# Patient Record
Sex: Female | Born: 1985 | Race: White | Hispanic: No | Marital: Single | Smoking: Former smoker
Health system: Southern US, Community
[De-identification: ages and names within clinical notes are randomized; demographics above are authoritative.]

## PROBLEM LIST (undated history)

## (undated) HISTORY — PX: OVARIAN CYST REMOVAL: SHX89

## (undated) HISTORY — PX: GUM SURGERY: SHX658

## (undated) HISTORY — PX: TONSILLECTOMY: SUR1361

---

## 2017-10-31 ENCOUNTER — Encounter: Payer: Self-pay | Admitting: Emergency Medicine

## 2017-10-31 ENCOUNTER — Emergency Department
Admission: EM | Admit: 2017-10-31 | Discharge: 2017-10-31 | Disposition: A | Discharge status: Home / Self Care | Payer: Self-pay | Attending: Emergency Medicine | Admitting: Emergency Medicine

## 2017-10-31 ENCOUNTER — Emergency Department: Payer: Self-pay

## 2017-10-31 DIAGNOSIS — Y998 Other external cause status: Secondary | ICD-10-CM | POA: Insufficient documentation

## 2017-10-31 DIAGNOSIS — Z87891 Personal history of nicotine dependence: Secondary | ICD-10-CM | POA: Insufficient documentation

## 2017-10-31 DIAGNOSIS — Y92002 Bathroom of unspecified non-institutional (private) residence single-family (private) house as the place of occurrence of the external cause: Secondary | ICD-10-CM | POA: Insufficient documentation

## 2017-10-31 DIAGNOSIS — S60212A Contusion of left wrist, initial encounter: Secondary | ICD-10-CM | POA: Insufficient documentation

## 2017-10-31 DIAGNOSIS — Y93E1 Activity, personal bathing and showering: Secondary | ICD-10-CM | POA: Insufficient documentation

## 2017-10-31 DIAGNOSIS — Y92009 Unspecified place in unspecified non-institutional (private) residence as the place of occurrence of the external cause: Secondary | ICD-10-CM

## 2017-10-31 DIAGNOSIS — W19XXXA Unspecified fall, initial encounter: Secondary | ICD-10-CM

## 2017-10-31 DIAGNOSIS — W010XXA Fall on same level from slipping, tripping and stumbling without subsequent striking against object, initial encounter: Secondary | ICD-10-CM | POA: Insufficient documentation

## 2017-10-31 NOTE — Discharge Instructions (Addendum)
Your exam and x-ray are negative for fracture or dislocation. Wear the splint for comfort and support. Apply ice to reduce swelling. Take Naproxen for pain and inflammation.

## 2017-10-31 NOTE — ED Triage Notes (Signed)
Patient states that she slipped and fell in the shower and landed on her left wrist. Patient with pain and swelling to left wrist.

## 2017-10-31 NOTE — ED Notes (Signed)
Pt states that she was in the shower when she slipped getting out of shower and she landed on her left hand and wrist. Pt alert and oriented x 4. No LOC. Family at bedside.

## 2017-11-04 NOTE — ED Provider Notes (Signed)
[facility] Emergency Department Provider Note ____________________________________________  Time seen: 2018  I have reviewed the triage vital signs and the nursing notes.  HISTORY  Chief Complaint  Fall and Wrist Pain  HPI Anna Gilbert is a 32 y.o. female presents to the ED accompanied by family, for evaluation of injury after a slip and fall.  Patient describes she slipped and fell getting out of her shower at home, and landed on her left wrist.  She denies any head injury, loss of consciousness, laceration, or abrasion patient presents with some swelling to the dorsal aspect of the left hand and wrist.  No other injuries reported at this time.  History reviewed. No pertinent past medical history.  There are no active problems to display for this patient.   Past Surgical History:  Procedure Laterality Date  . CESAREAN SECTION    . GUM SURGERY    . OVARIAN CYST REMOVAL    . TONSILLECTOMY      Prior to Admission medications   Not on File    Allergies Fish allergy; Flagyl [metronidazole]; and Shellfish allergy  No family history on file.  Social History Social History   Tobacco Use  . Smoking status: Former Games developer  . Smokeless tobacco: Never Used  Substance Use Topics  . Alcohol use: Not on file  . Drug use: Not on file    Review of Systems  Constitutional: Negative for fever. Eyes: Negative for visual changes. ENT: Negative for sore throat. Cardiovascular: Negative for chest pain. Respiratory: Negative for shortness of breath. Gastrointestinal: Negative for abdominal pain, vomiting and diarrhea. Genitourinary: Negative for dysuria. Musculoskeletal: Negative for back pain.  Left wrist pain as above. Skin: Negative for rash. Neurological: Negative for headaches, focal weakness or numbness. ____________________________________________  PHYSICAL EXAM:  VITAL SIGNS: ED Triage Vitals  Enc Vitals Group     BP 10/31/17 1932 (!) 129/47      Pulse Rate 10/31/17 1932 77     Resp 10/31/17 1932 18     Temp 10/31/17 1931 98.4 F (36.9 C)     Temp Source 10/31/17 1931 Oral     SpO2 10/31/17 1932 99 %     Weight 10/31/17 1931 155 lb (70.3 kg)     Height 10/31/17 1931 5\' 4"  (1.626 m)     Head Circumference --      Peak Flow --      Pain Score 10/31/17 1931 10     Pain Loc --      Pain Edu? --      Excl. in GC? --     Constitutional: Alert and oriented. Well appearing and in no distress. Head: Normocephalic and atraumatic. Cardiovascular: Normal rate, regular rhythm. Normal distal pulses. Respiratory: Normal respiratory effort. No wheezes/rales/rhonchi. Musculoskeletal: Left wrist without obvious deformity or dislocation.  Some soft tissue swelling noted dorsally to the wrist.  Patient with normal composite fist and normal wrist flexion extension range.  Nontender with normal range of motion in all extremities.  Neurologic: Nerves II through XII grossly intact.  Normal UE DTRs bilaterally.  Normal intrinsic and opposition testing noted.  Normal speech and language. No gross focal neurologic deficits are appreciated. Skin:  Skin is warm, dry and intact. No rash noted. ____________________________________________   RADIOLOGY  Left Wrist IMPRESSION: Negative. ____________________________________________  PROCEDURES  Procedures Ace bandage ____________________________________________  INITIAL IMPRESSION / ASSESSMENT AND PLAN / ED COURSE  She with ED evaluation of injury sustained following a mechanical fall.  Patient exam  is overall benign and her x-ray is reassuring as it shows no acute doctor or dislocation.  Patient was placed in Ace bandage for support and will follow-up with her primary provider for ongoing symptoms. ____________________________________________  FINAL CLINICAL IMPRESSION(S) / ED DIAGNOSES  Final diagnoses:  Fall in home, initial encounter  Contusion of left wrist, initial encounter       Lissa HoardMenshew, Kendle Turbin V Bacon, PA-C 11/04/17 1654    Jeanmarie PlantMcShane, James A, MD 11/14/17 1540

## 2018-10-12 IMAGING — DX DG WRIST COMPLETE 3+V*L*
4 series · 4 of 4 positions shown · non-contrast
Comparison: None.

CLINICAL DATA: Slip and fall in shower landing on LEFT wrist. Pain
and swelling.

EXAM:
LEFT WRIST - COMPLETE 3+ VIEW

[wrist ap (1 of 2)]
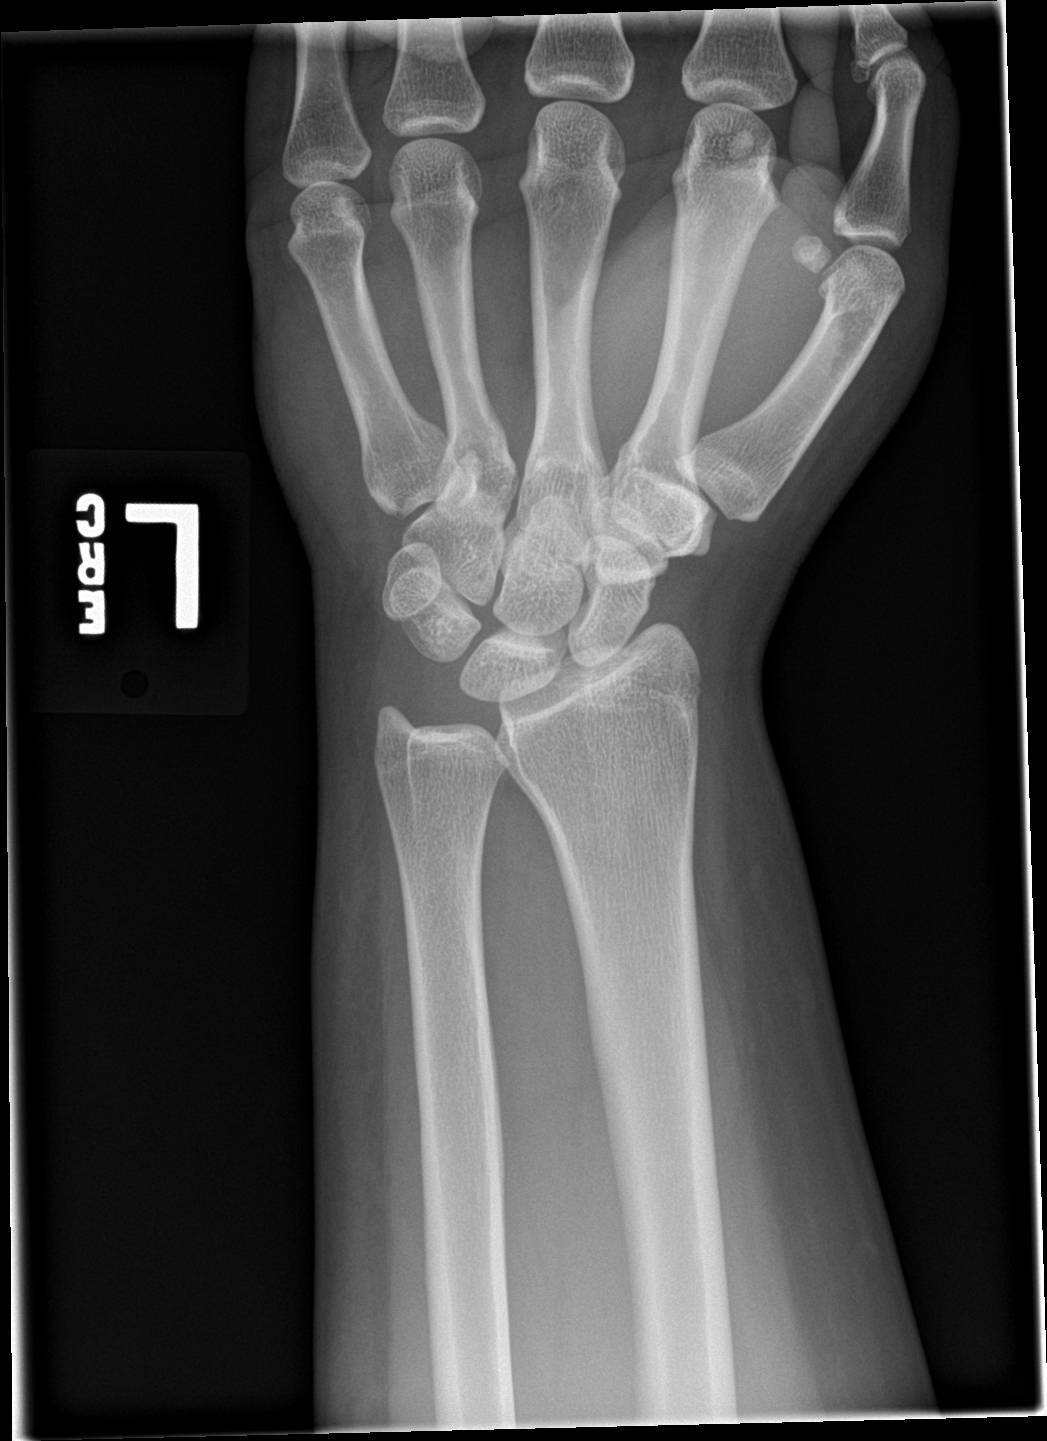

[wrist obl]
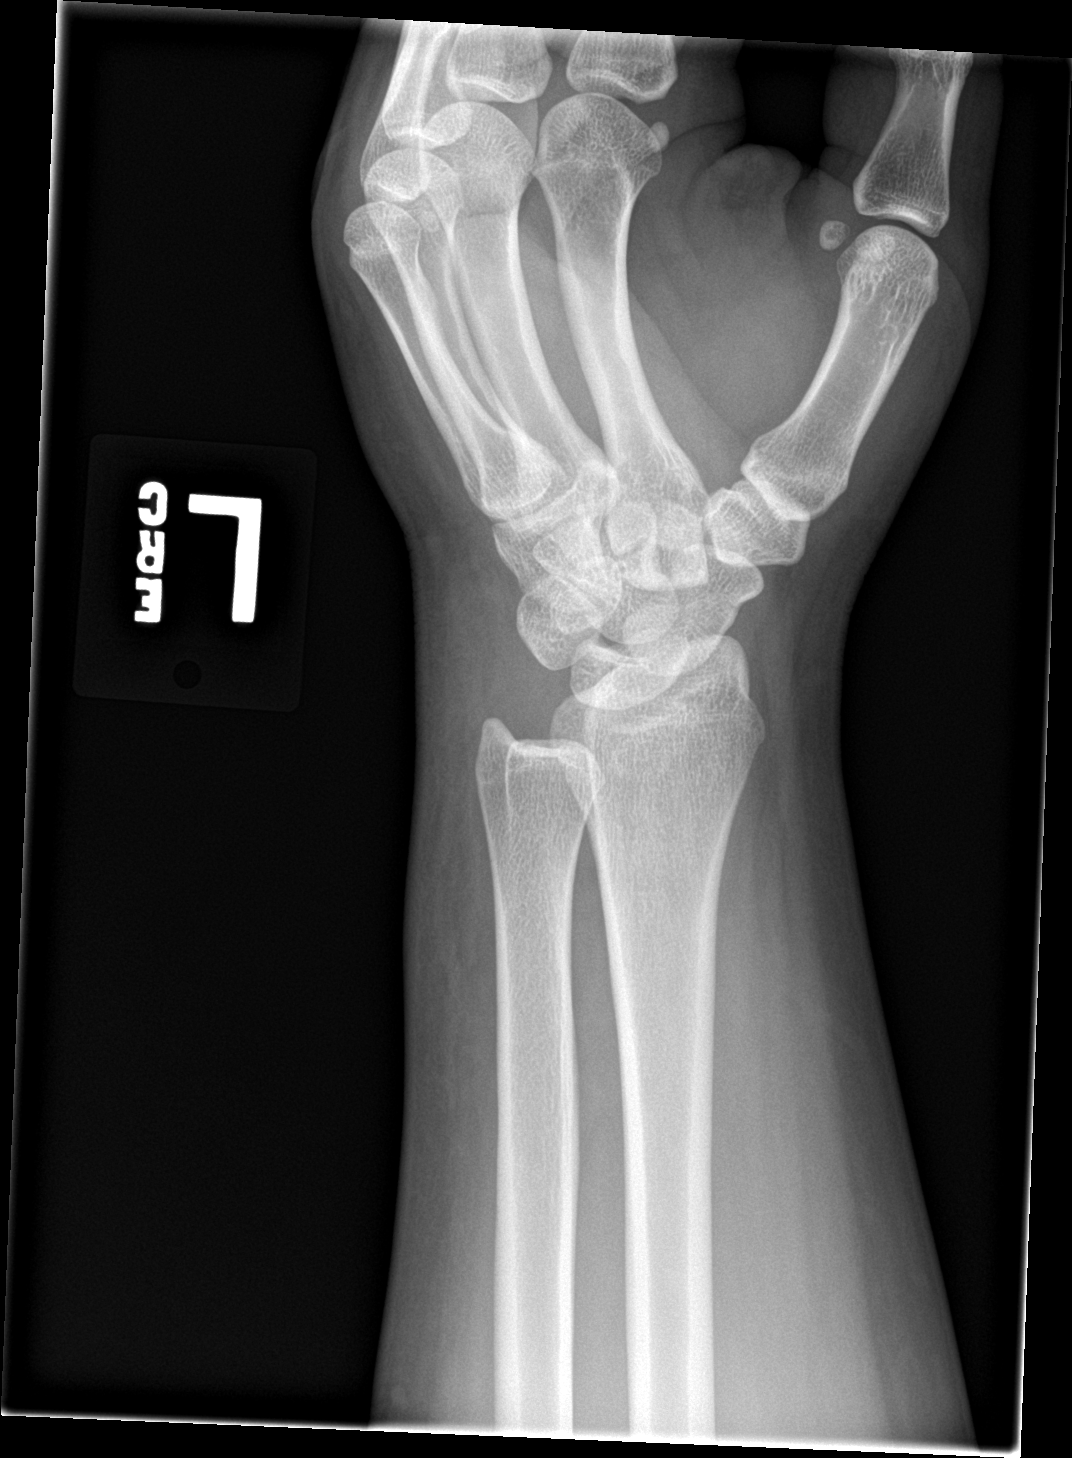

[wrist lat]
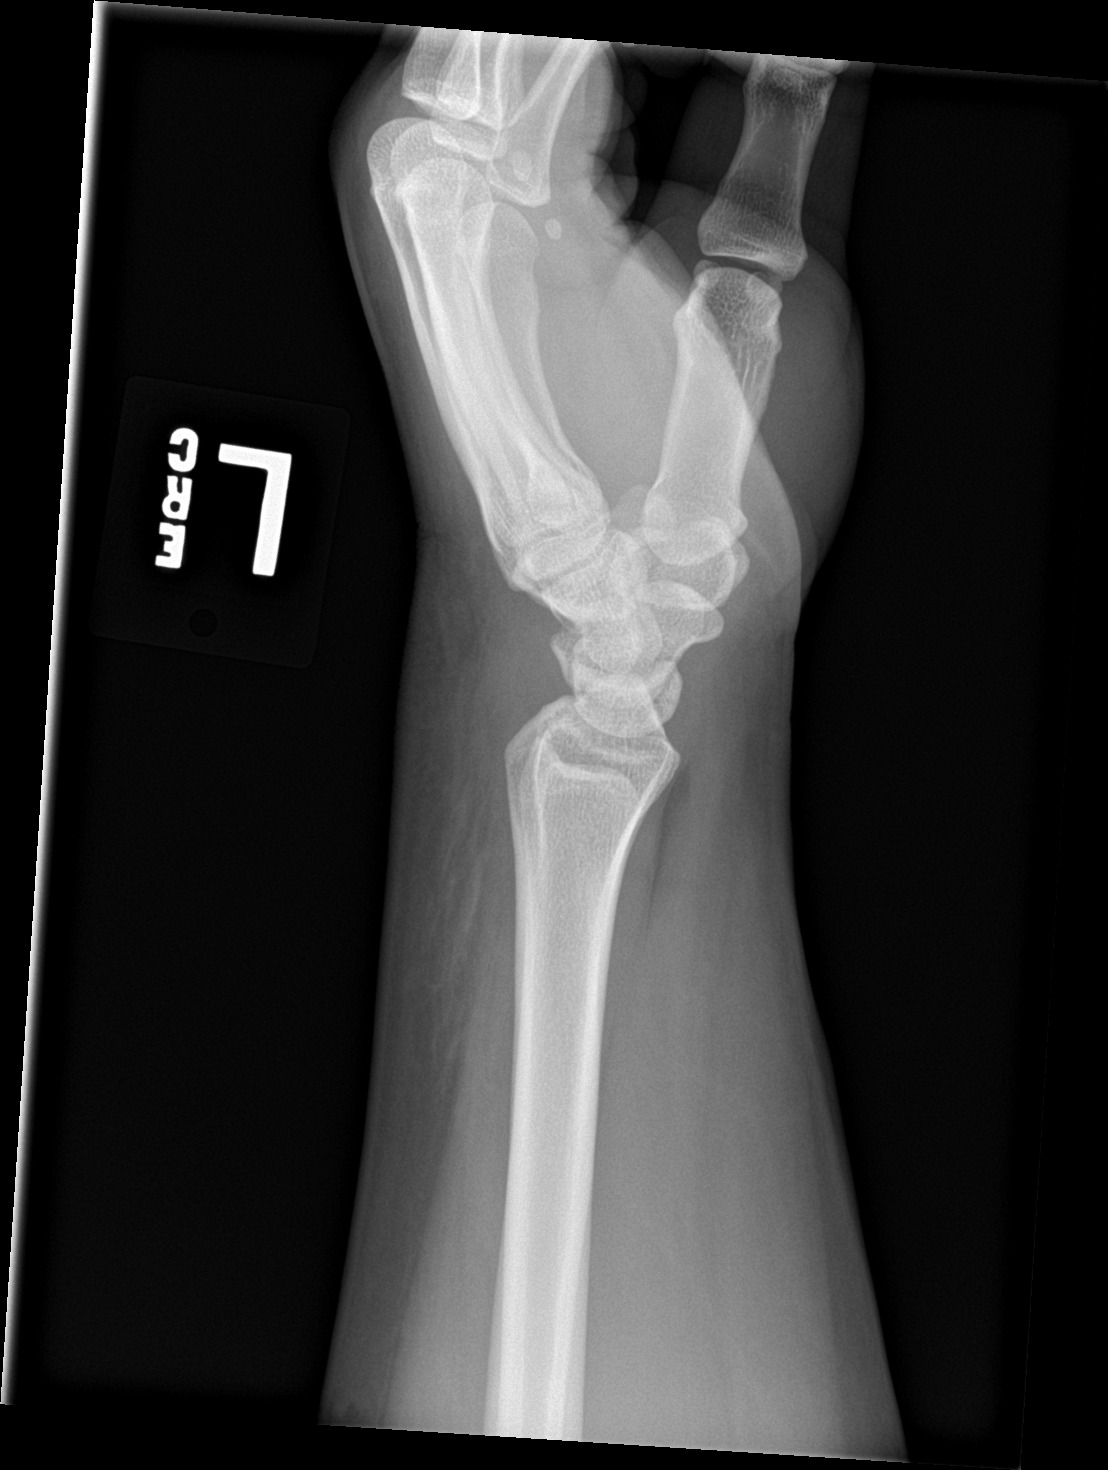

[wrist ap (2 of 2)]
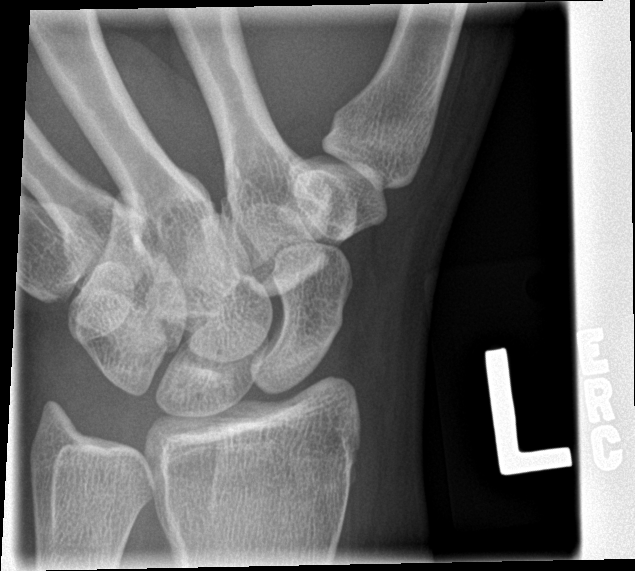

[4 of 4 positions shown; findings below may reference images not displayed]

FINDINGS: There is no evidence of fracture or dislocation. There is no
evidence of arthropathy or other focal bone abnormality. Soft
tissues are unremarkable.
IMPRESSION: Negative.
